# Patient Record
Sex: Female | Born: 1980 | Race: White | Hispanic: No | Smoking: Never smoker
Health system: Southern US, Community
[De-identification: ages and names within clinical notes are randomized; demographics above are authoritative.]

---

## 2020-05-07 ENCOUNTER — Other Ambulatory Visit: Payer: Self-pay

## 2020-05-07 ENCOUNTER — Ambulatory Visit (INDEPENDENT_AMBULATORY_CARE_PROVIDER_SITE_OTHER): Payer: BLUE CROSS/BLUE SHIELD

## 2020-05-07 ENCOUNTER — Encounter: Payer: Self-pay | Admitting: Podiatry

## 2020-05-07 ENCOUNTER — Ambulatory Visit: Payer: BLUE CROSS/BLUE SHIELD | Admitting: Podiatry

## 2020-05-07 DIAGNOSIS — M722 Plantar fascial fibromatosis: Secondary | ICD-10-CM | POA: Diagnosis not present

## 2020-05-07 DIAGNOSIS — M2011 Hallux valgus (acquired), right foot: Secondary | ICD-10-CM | POA: Diagnosis not present

## 2020-05-07 NOTE — Progress Notes (Signed)
  Subjective:  Patient ID: Mikayla Anderson, female    DOB: 1980/05/03,  MRN: [id number]  Chief Complaint  Patient presents with  . Foot Pain    Patient presents today for left heel pain and right hallux 1st mpj pain    40 y.o. female presents with the above complaint.  Patient presents with complaint left heel pain that has been going on for quite some time.  Patient states it painful to touch.  She wants to tell both works about 12-hour shifts.  She states that is painful to touch painful when taking the first step especially in the morning.  It does get better after this continuous movement on the foot.  She would like to discuss treatment options she has not seen anyone else prior to seeing me.  She has not had any other treatments done to this heel.   Review of Systems: Negative except as noted in the HPI. Denies N/V/F/Ch.  No past medical history on file.  Current Outpatient Medications:  .  ibuprofen (ADVIL) 200 MG tablet, Take 200 mg by mouth every 6 (six) hours as needed., Disp: , Rfl:   Social History   Tobacco Use  Smoking Status Never Smoker  Smokeless Tobacco Never Used    Allergies  Allergen Reactions  . Penicillins Rash   Objective:  There were no vitals filed for this visit. There is no height or weight on file to calculate BMI. Constitutional Well developed. Well nourished.  Vascular Dorsalis pedis pulses palpable bilaterally. Posterior tibial pulses palpable bilaterally. Capillary refill normal to all digits.  No cyanosis or clubbing noted. Pedal hair growth normal.  Neurologic Normal speech. Oriented to person, place, and time. Epicritic sensation to light touch grossly present bilaterally.  Dermatologic Nails well groomed and normal in appearance. No open wounds. No skin lesions.  Orthopedic: Normal joint ROM without pain or crepitus bilaterally. No visible deformities. Tender to palpation at the calcaneal tuber left. No pain with calcaneal squeeze  left. Ankle ROM diminished range of motion left. Silfverskiold Test: positive left.   Radiographs: Taken and reviewed. No acute fractures or dislocations. No evidence of stress fracture.  Plantar heel spur present. Posterior heel spur present.   Assessment:   1. Hav (hallux abducto valgus), right   2. Plantar fasciitis of left foot    Plan:  Patient was evaluated and treated and all questions answered.  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  I discussed with her that this is likely attribute it from semiflexible past planovalgus foot structure.  Patient states understanding.  Given for now she is managing with shoe gear modification will continue doing that.  There is continuous pain will discuss surgical options.  Plantar Fasciitis, left - XR reviewed as above.  - Educated on icing and stretching. Instructions given.  - Injection delivered to the plantar fascia as below. - DME: Plantar Fascial Brace - Pharmacologic management: None  Procedure: Injection Tendon/Ligament Location: Left plantar fascia at the glabrous junction; medial approach. Skin Prep: alcohol Injectate: 0.5 cc 0.5% marcaine plain, 0.5 cc of 1% Lidocaine, 0.5 cc kenalog 10. Disposition: Patient tolerated procedure well. Injection site dressed with a band-aid.  No follow-ups on file.

## 2020-06-04 ENCOUNTER — Ambulatory Visit: Payer: BLUE CROSS/BLUE SHIELD | Admitting: Podiatry

## 2020-06-11 ENCOUNTER — Encounter: Payer: Self-pay | Admitting: Podiatry

## 2020-06-11 ENCOUNTER — Other Ambulatory Visit: Payer: Self-pay

## 2020-06-11 ENCOUNTER — Ambulatory Visit: Payer: BLUE CROSS/BLUE SHIELD | Admitting: Podiatry

## 2020-06-11 DIAGNOSIS — M722 Plantar fascial fibromatosis: Secondary | ICD-10-CM

## 2020-06-11 DIAGNOSIS — Q666 Other congenital valgus deformities of feet: Secondary | ICD-10-CM | POA: Diagnosis not present

## 2020-06-11 NOTE — Progress Notes (Signed)
Subjective:  Patient ID: Mikayla Anderson, female    DOB: 08-28-1980,  MRN: [id number]  Chief Complaint  Patient presents with  . Plantar Fasciitis    "its no better.  Still hurts after walking all day and I rest then go to stand back up"    40 y.o. female presents with the above complaint.  Patient presents with follow-up of left plantar fasciitis.  She still continues to have pain.  She works about 12-hour shifts.  She states the bracing helps but the injection did not help at all.  She does not want to going to boot as she cannot afford to take time off.  She denies any other acute complaints.   Review of Systems: Negative except as noted in the HPI. Denies N/V/F/Ch.  No past medical history on file.  Current Outpatient Medications:  .  ibuprofen (ADVIL) 200 MG tablet, Take 200 mg by mouth every 6 (six) hours as needed., Disp: , Rfl:   Social History   Tobacco Use  Smoking Status Never Smoker  Smokeless Tobacco Never Used    Allergies  Allergen Reactions  . Penicillins Rash   Objective:  There were no vitals filed for this visit. There is no height or weight on file to calculate BMI. Constitutional Well developed. Well nourished.  Vascular Dorsalis pedis pulses palpable bilaterally. Posterior tibial pulses palpable bilaterally. Capillary refill normal to all digits.  No cyanosis or clubbing noted. Pedal hair growth normal.  Neurologic Normal speech. Oriented to person, place, and time. Epicritic sensation to light touch grossly present bilaterally.  Dermatologic Nails well groomed and normal in appearance. No open wounds. No skin lesions.  Orthopedic: Normal joint ROM without pain or crepitus bilaterally. No visible deformities. Tender to palpation at the calcaneal tuber left. No pain with calcaneal squeeze left. Ankle ROM diminished range of motion left. Silfverskiold Test: positive left.   Radiographs: Taken and reviewed. No acute fractures or dislocations. No  evidence of stress fracture.  Plantar heel spur present. Posterior heel spur present.   Assessment:   1. Pes planovalgus   2. Plantar fasciitis of left foot    Plan:  Patient was evaluated and treated and all questions answered.  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  I discussed with her that this is likely attribute it from semiflexible past planovalgus foot structure.  Patient states understanding.  Given for now she is managing with shoe gear modification will continue doing that.  There is continuous pain will discuss surgical options.  Plantar Fasciitis, left - XR reviewed as above.  - Educated on icing and stretching. Instructions given.  -No further injection delivered to the plantar fascia as below. - DME: Continue plantar Fascial Brace - Pharmacologic management: None -At this time patient is not cannot afford to take time off work and is unable to wear a cam boot to help with the pain.  I discussed with her that she may benefit from orthotics.  She can continue doing bracing and stretching and if it continues to get worse, see me right away.  She states understanding   Semiflexible pes planovalgus -I explained the patient the etiology of pes planovalgus and various treatment options were extensively discussed.  Given that she continues to have plantar fasciitis pain I believe she will benefit from custom orthotics to help control the hindfoot motion and support the arch of the foot take the stress away from plantar fascia.  Patient states understanding like to proceed with the orthotics. -Should be scheduled see the orthotics department for custom orthotics No follow-ups on file.

## 2020-11-13 ENCOUNTER — Other Ambulatory Visit: Payer: Self-pay | Admitting: Family Medicine

## 2020-11-13 DIAGNOSIS — Z1231 Encounter for screening mammogram for malignant neoplasm of breast: Secondary | ICD-10-CM

## 2020-11-20 ENCOUNTER — Other Ambulatory Visit: Payer: Self-pay

## 2020-11-20 ENCOUNTER — Ambulatory Visit
Admission: RE | Admit: 2020-11-20 | Discharge: 2020-11-20 | Disposition: A | Discharge status: Home / Self Care | Payer: BLUE CROSS/BLUE SHIELD | Source: Ambulatory Visit | Attending: Family Medicine | Admitting: Family Medicine

## 2020-11-20 DIAGNOSIS — Z1231 Encounter for screening mammogram for malignant neoplasm of breast: Secondary | ICD-10-CM | POA: Insufficient documentation

## 2021-11-03 ENCOUNTER — Other Ambulatory Visit: Payer: Self-pay | Admitting: Family Medicine

## 2021-11-03 DIAGNOSIS — Z1231 Encounter for screening mammogram for malignant neoplasm of breast: Secondary | ICD-10-CM

## 2023-06-17 IMAGING — MG MM DIGITAL SCREENING BILAT W/ TOMO AND CAD
8 series · 8 of 24 positions shown · non-contrast
Comparison: None.

CLINICAL DATA: Screening.

EXAM:
DIGITAL SCREENING BILATERAL MAMMOGRAM WITH TOMOSYNTHESIS AND CAD
TECHNIQUE: Bilateral screening digital craniocaudal and mediolateral oblique
mammograms were obtained. Bilateral screening digital breast
tomosynthesis was performed. The images were evaluated with
computer-aided detection.

[L MLO synth-2D]
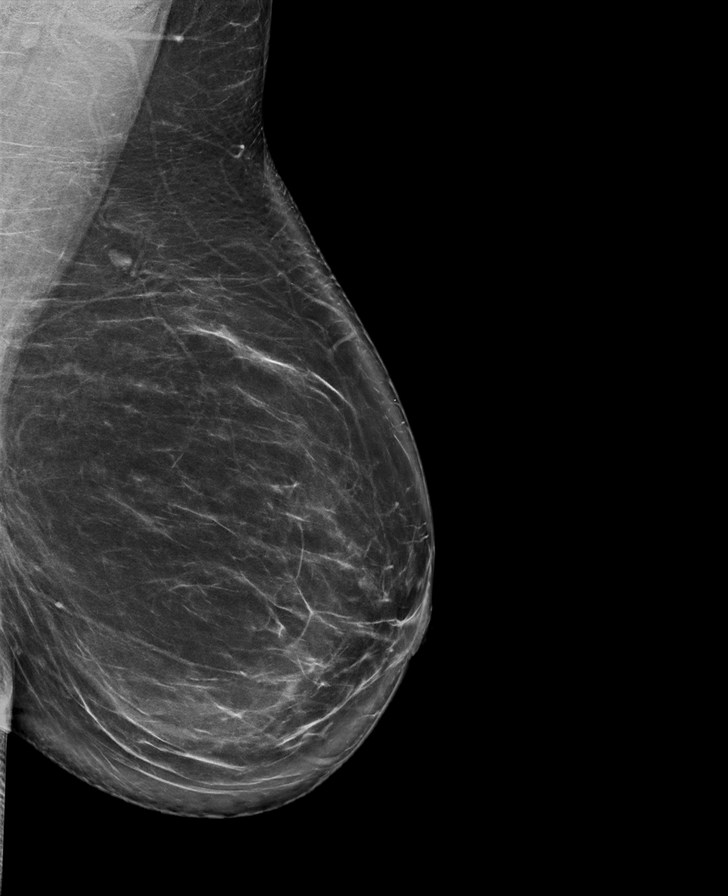

[L CC synth-2D]
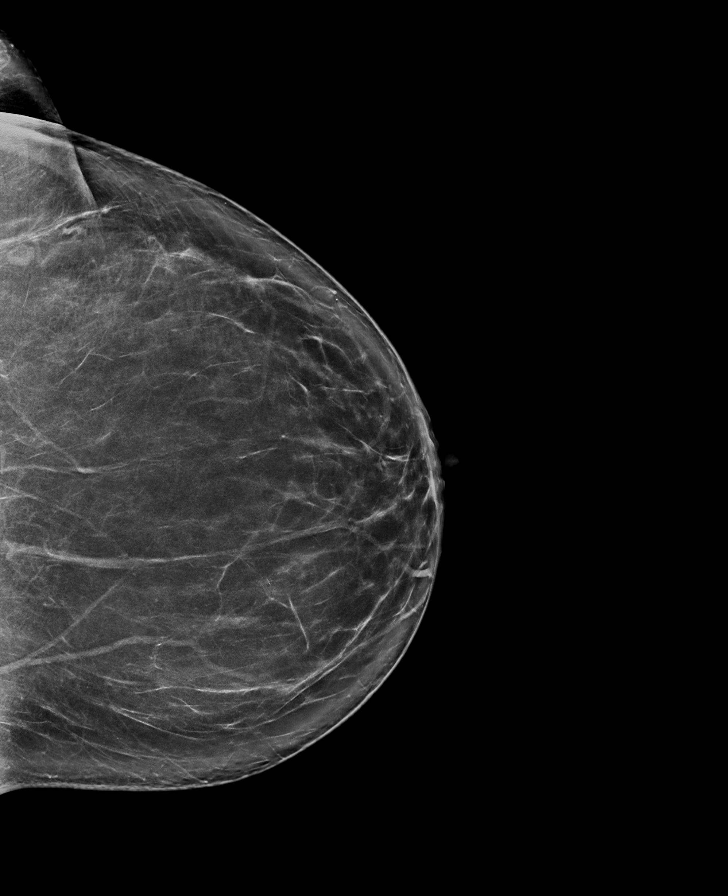

[R MLO synth-2D]
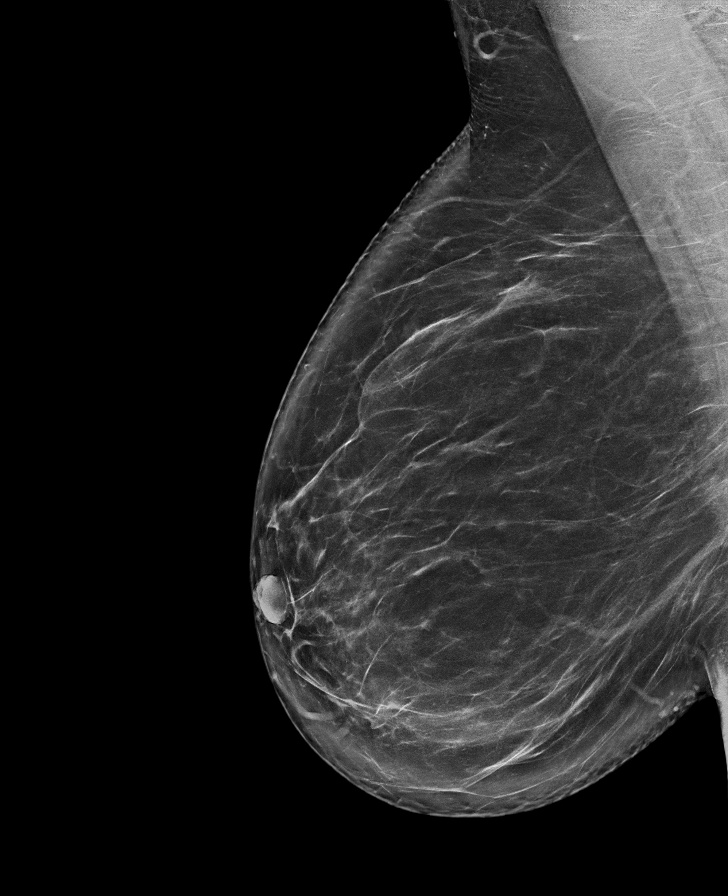

[R CC synth-2D]
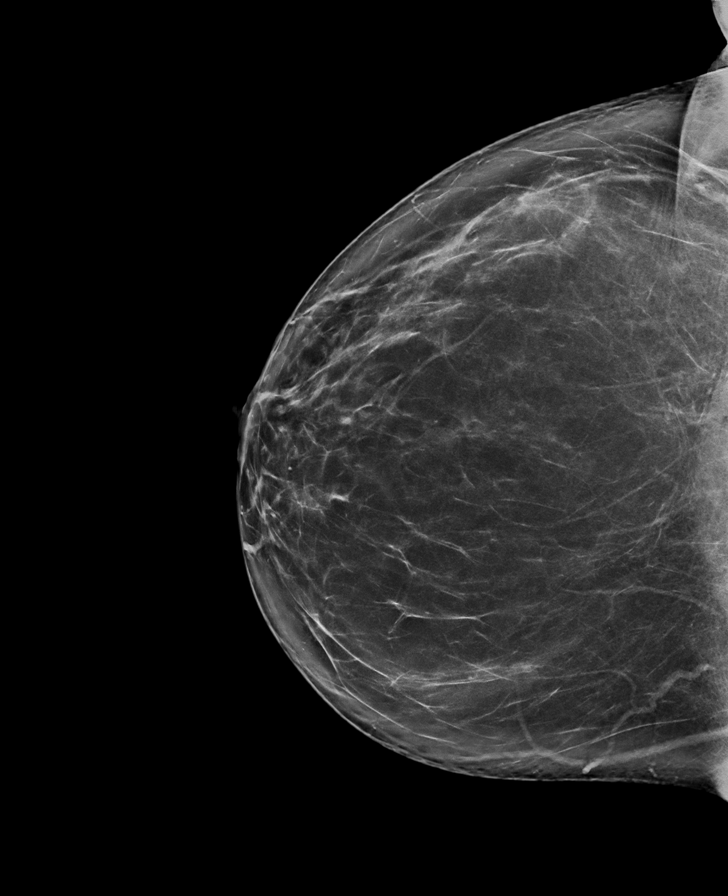

[L MLO tomo · tomo slice 52/103.0]
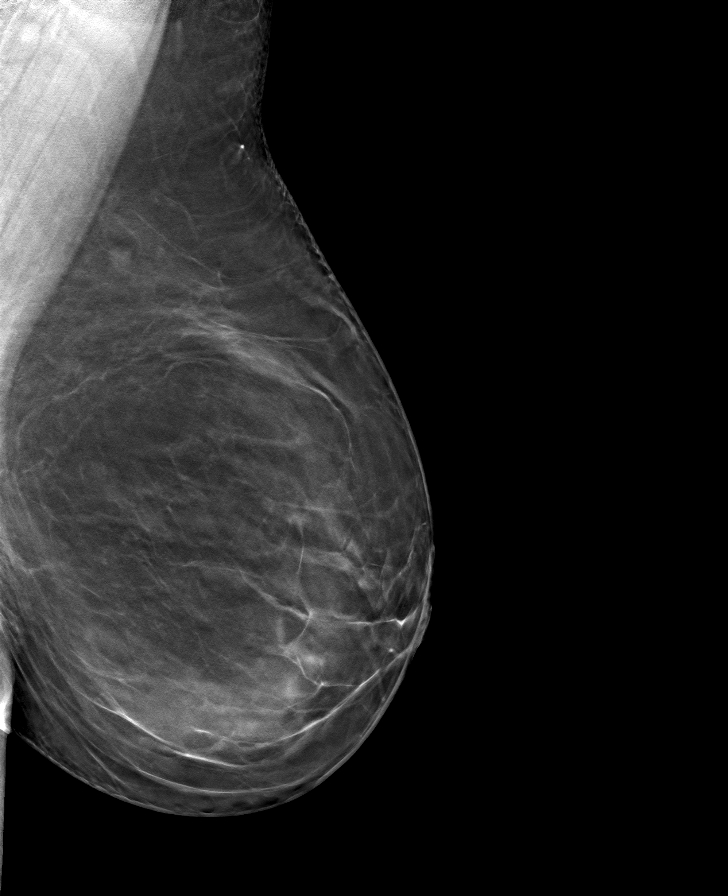

[L CC tomo · tomo slice 45/90.0]
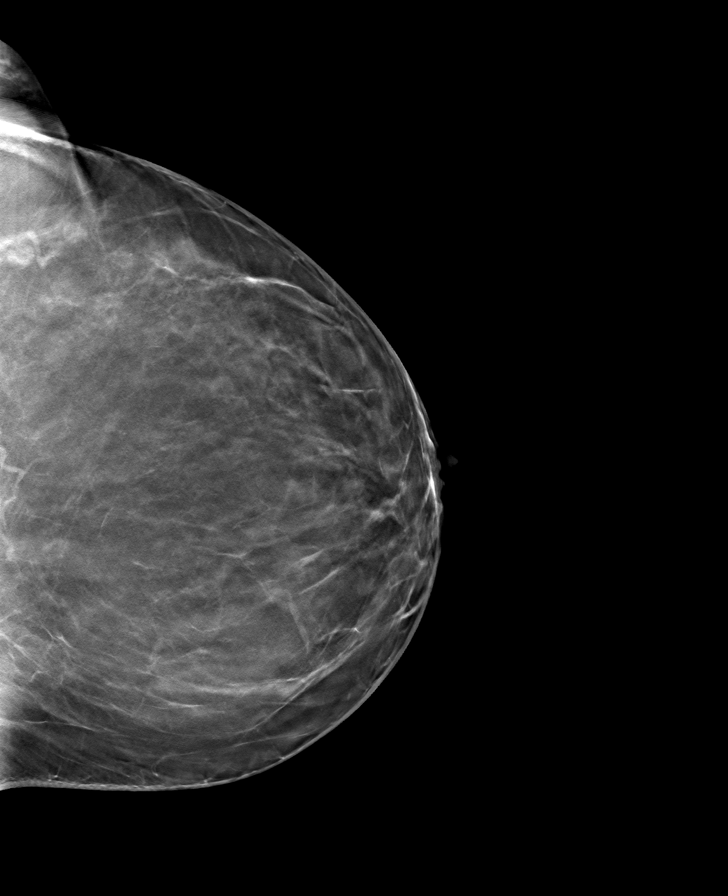

[R CC tomo · tomo slice 46/91.0]
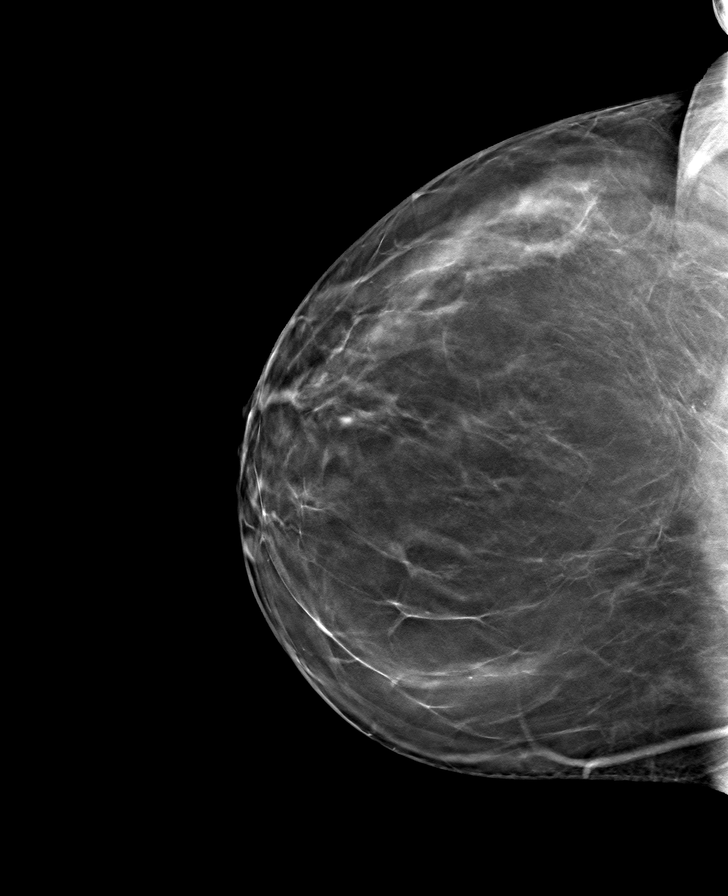

[R MLO tomo · tomo slice 53/105.0]
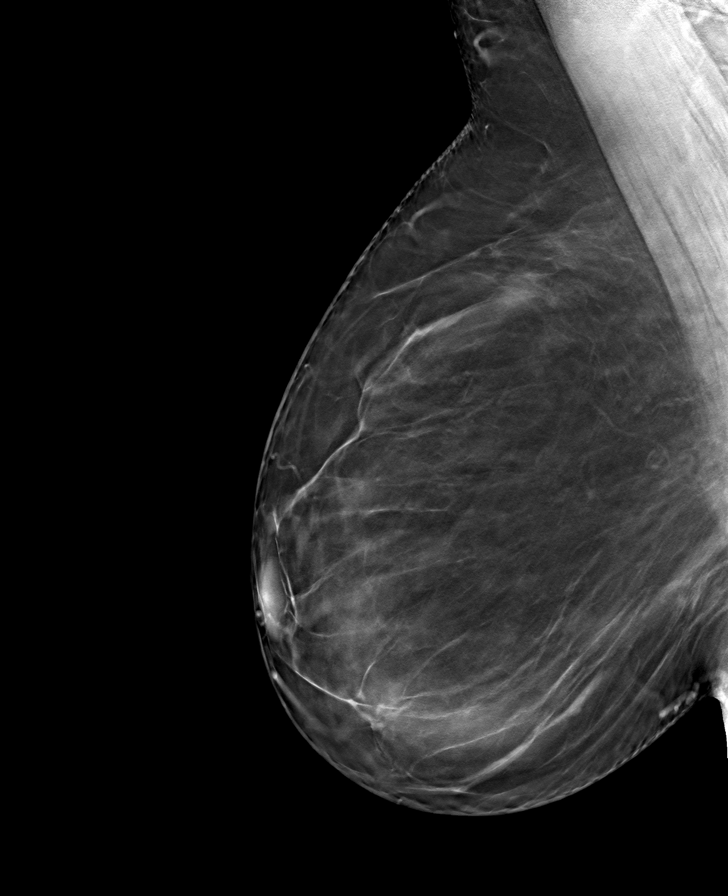

[8 of 24 positions shown; findings below may reference images not displayed]

ACR Breast Density Category b: There are scattered areas of
fibroglandular density.
FINDINGS: There are no findings suspicious for malignancy.
IMPRESSION: No mammographic evidence of malignancy. A result letter of this
screening mammogram will be mailed directly to the patient.

RECOMMENDATION:
Screening mammogram in one year. (Code:XG-X-X7B)

BI-RADS CATEGORY  1: Negative.
